# Patient Record
Sex: Male | Born: 2005 | Hispanic: No | Marital: Single | Smoking: Never smoker
Health system: Southern US, Community
[De-identification: ages and names within clinical notes are randomized; demographics above are authoritative.]

---
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
## 2024-03-31 ENCOUNTER — Other Ambulatory Visit: Payer: Self-pay

## 2024-03-31 ENCOUNTER — Emergency Department

## 2024-03-31 ENCOUNTER — Emergency Department
Admission: EM | Admit: 2024-03-31 | Discharge: 2024-03-31 | Disposition: A | Discharge status: Home / Self Care | Attending: Emergency Medicine | Admitting: Emergency Medicine

## 2024-03-31 DIAGNOSIS — X500XXA Overexertion from strenuous movement or load, initial encounter: Secondary | ICD-10-CM | POA: Diagnosis not present

## 2024-03-31 DIAGNOSIS — S43014A Anterior dislocation of right humerus, initial encounter: Secondary | ICD-10-CM | POA: Diagnosis not present

## 2024-03-31 DIAGNOSIS — S4991XA Unspecified injury of right shoulder and upper arm, initial encounter: Secondary | ICD-10-CM | POA: Diagnosis present

## 2024-03-31 DIAGNOSIS — Y9367 Activity, basketball: Secondary | ICD-10-CM | POA: Diagnosis not present

## 2024-03-31 MED ORDER — NAPROXEN 500 MG PO TABS
500.0000 mg | ORAL_TABLET | Freq: Once | ORAL | Status: AC
Start: 2024-03-31 — End: 2024-03-31
  Administered 2024-03-31: 500 mg via ORAL
  Filled 2024-03-31: qty 1

## 2024-03-31 NOTE — Discharge Instructions (Addendum)
 Your shoulder xray today confirms that your shoulder is successfully back in place.   Keep the sling/immobilizer in place at all times. It is okay to take it off for showering, but during this time you should keep the right arm hanging at your side.  Do not raise your right arm to chest height or above your head until you follow up with orthopedics.  Take naproxen 500mg  two times a day for the next week, or take ibuprofen  600mg  every 6 hours to help control soreness in the shoulder.

## 2024-03-31 NOTE — ED Notes (Signed)
 Edp and primary rn at bedside

## 2024-03-31 NOTE — ED Triage Notes (Signed)
 Pt was play basketball when he went to raise his right arm. At the time his right shoulder came out of socket. This occurred approx 30 min PTA. Sts this has happened once before. No hx surgeries.  Requires spanish speaking interpreter.

## 2024-03-31 NOTE — ED Provider Notes (Signed)
 [facility] Provider Note    Event Date/Time   First MD Initiated Contact with Patient 03/31/24 2034     (approximate)   History   Chief Complaint: Shoulder Pain (R)   HPI  Adam Mccormick is a 18 y.o. male who close history of dislocation of the right shoulder once before who comes ED complaining of right shoulder pain that started suddenly while playing basketball.  He was reaching out to catch a basketball with his outstretched, extended externally rotated right arm when he had sudden pain and decreased range of motion.  No significant trauma, no fall.        No past medical history on file.  Current Outpatient Rx   Order #: [id number] Class: Normal    No past surgical history on file.  Physical Exam   Triage Vital Signs: ED Triage Vitals  Encounter Vitals Group     BP 03/31/24 2036 (!) 150/88     Girls Systolic BP Percentile --      Girls Diastolic BP Percentile --      Boys Systolic BP Percentile --      Boys Diastolic BP Percentile --      Pulse Rate 03/31/24 2036 85     Resp 03/31/24 2036 (!) 21     Temp 03/31/24 2036 97.7 F (36.5 C)     Temp Source 03/31/24 2036 Oral     SpO2 03/31/24 2036 99 %     Weight --      Height --      Head Circumference --      Peak Flow --      Pain Score 03/31/24 2037 8     Pain Loc --      Pain Education --      Exclude from Growth Chart --     Most recent vital signs: Vitals:   03/31/24 2036 03/31/24 2050  BP: (!) 150/88   Pulse: 85 60  Resp: (!) 21   Temp: 97.7 F (36.5 C)   SpO2: 99% 100%    General: Awake, no distress.  CV:  Good peripheral perfusion.  Normal radial pulse Resp:  Normal effort.  Abd:  No distention.  Other:  Normal motor function and sensation.  There is emptiness over the right lateral deltoid with fullness and tenderness over the anterior aspect of the right glenoid.  Clinically apparent right shoulder dislocation anteriorly   ED Results / Procedures /  Treatments   Labs (all labs ordered are listed, but only abnormal results are displayed) Labs Reviewed - No data to display   EKG    RADIOLOGY Postreduction x-ray right shoulder interpreted by me, shows normal anatomic positioning.  Radiology report reviewed   PROCEDURES:  .Reduction of dislocation  Date/Time: 03/31/2024 11:59 PM  Performed by: Viviann Pastor, MD Authorized by: Viviann Pastor, MD  Consent: Verbal consent obtained Risks and benefits: risks, benefits and alternatives were discussed Consent given by: patient Patient identity confirmed: verbally with patient Time out: Immediately prior to procedure a time out was called to verify the correct patient, procedure, equipment, support staff and site/side marked as required. Local anesthesia used: no  Anesthesia: Local anesthesia used: no  Sedation: Patient sedated: no  Patient tolerance: patient tolerated the procedure well with no immediate complications      MEDICATIONS ORDERED IN ED: Medications  naproxen (NAPROSYN) tablet 500 mg (500 mg Oral Given 03/31/24 2118)     IMPRESSION / MDM / ASSESSMENT AND PLAN /  ED COURSE  I reviewed the triage vital signs and the nursing notes.  DDx: Shoulder dislocation  Patient's presentation is most consistent with acute complicated illness / injury requiring diagnostic workup.     Clinical Course as of 03/31/24 2359  Sat Mar 31, 2024  2045 Presents with clinically apparent right shoulder dislocation, atraumatic while reaching overhead playing basketball.  This was reduced at bedside immediately on arrival with external rotation and elbow adduction with clinical success and resolution of his pain.  Will obtain x-ray to ensure normal location of the shoulder. [PS]  2113 X-ray confirms normal anatomic location, no fracture.  Stable for discharge [PS]    Clinical Course User Index [PS] Viviann Pastor, MD     FINAL CLINICAL IMPRESSION(S) / ED DIAGNOSES    Final diagnoses:  Anterior dislocation of shoulder, right, initial encounter     Rx / DC Orders   ED Discharge Orders     None        Note:  This document was prepared using Dragon voice recognition software and may include unintentional dictation errors.   Viviann Pastor, MD 03/31/24 [phone]

## 2024-04-23 ENCOUNTER — Other Ambulatory Visit: Payer: Self-pay | Admitting: Orthopedic Surgery

## 2024-04-23 DIAGNOSIS — S43014A Anterior dislocation of right humerus, initial encounter: Secondary | ICD-10-CM

## 2024-05-10 ENCOUNTER — Inpatient Hospital Stay
Admission: RE | Admit: 2024-05-10 | Discharge: 2024-05-10 | Attending: Orthopedic Surgery | Admitting: Orthopedic Surgery

## 2024-05-10 DIAGNOSIS — S43014A Anterior dislocation of right humerus, initial encounter: Secondary | ICD-10-CM

## 2024-05-10 MED ORDER — IOPAMIDOL (ISOVUE-M 200) INJECTION 41%
10.0000 mL | Freq: Once | INTRAMUSCULAR | Status: AC
  Administered 2024-05-10: 16:00:00 10 mL via INTRA_ARTICULAR

## 2024-05-10 MED ORDER — GADOBENATE DIMEGLUMINE 529 MG/ML IV SOLN
0.1000 mL | Freq: Once | INTRAVENOUS | Status: AC
  Administered 2024-05-10: 16:00:00 0.1 mL via INTRA_ARTICULAR
